# Patient Record
Sex: Female | Born: 1970 | Race: Asian | Hispanic: No | Marital: Married | Smoking: Never smoker
Health system: Southern US, Community
[De-identification: ages and names within clinical notes are randomized; demographics above are authoritative.]

---

## 2010-02-23 ENCOUNTER — Emergency Department (HOSPITAL_COMMUNITY): Admission: EM | Admit: 2010-02-23 | Discharge: 2010-02-23 | Payer: Self-pay | Admitting: Emergency Medicine

## 2010-05-04 ENCOUNTER — Ambulatory Visit (HOSPITAL_COMMUNITY): Admission: RE | Admit: 2010-05-04 | Discharge: 2010-05-04 | Payer: Self-pay | Admitting: Obstetrics and Gynecology

## 2010-05-04 IMAGING — US US OB DETAIL+14 WK
1 series · 18 of 28 positions shown · non-contrast
Comparison: none

OBSTETRICAL ULTRASOUND:
 This ultrasound was performed in The [HOSPITAL], and the [facility] report will be stored to [REDACTED] PACS.  This report is also available in [HOSPITAL]?s accessANYware.

[Series 1: us ob detail+14 wk · 18 of 36 slices shown]
[im 1/36]
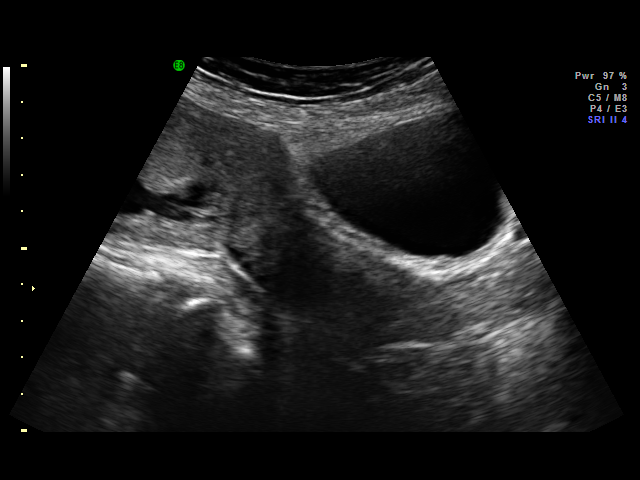
[im 3/36]
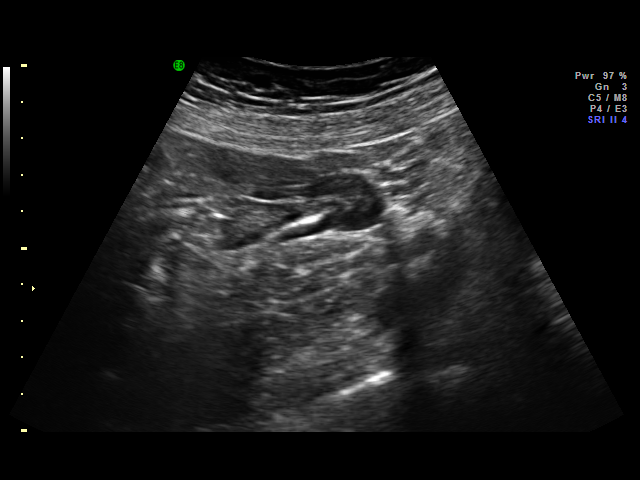
[im 4/36]
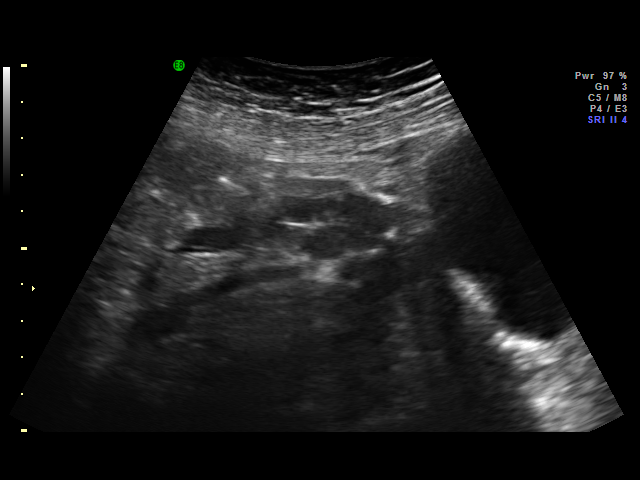
[im 7/36]
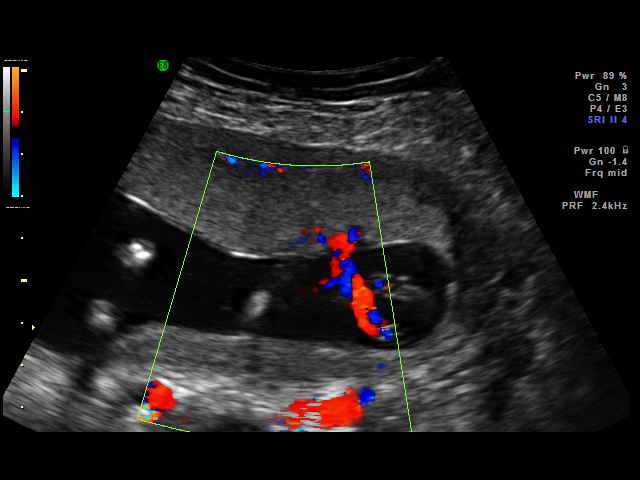
[im 10/36]
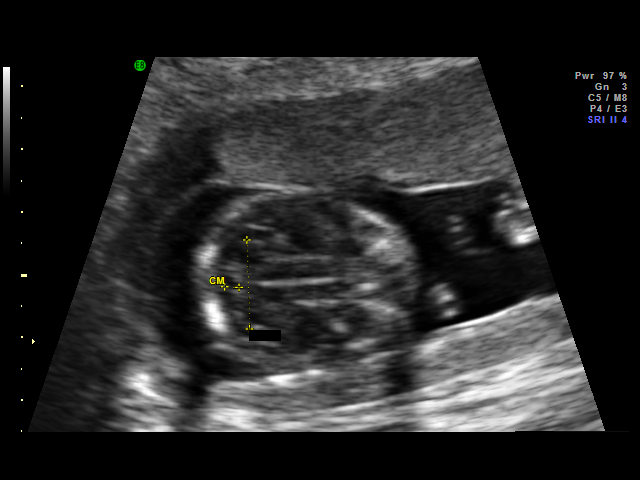
[im 11/36]
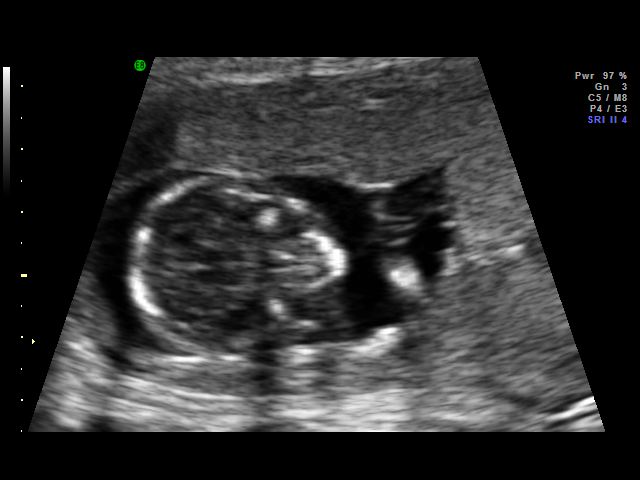
[im 13/36]
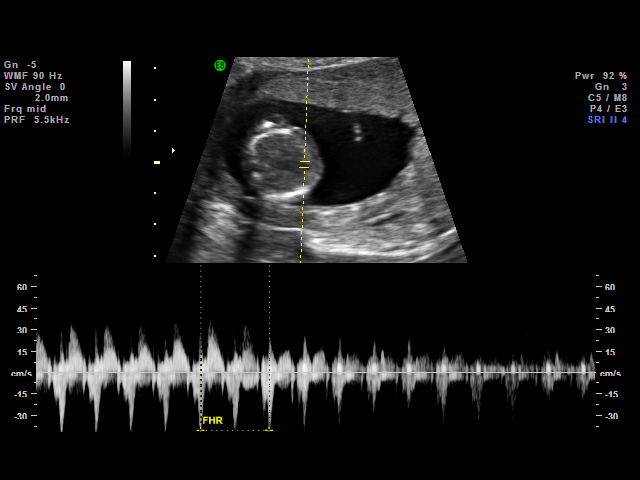
[im 15/36]
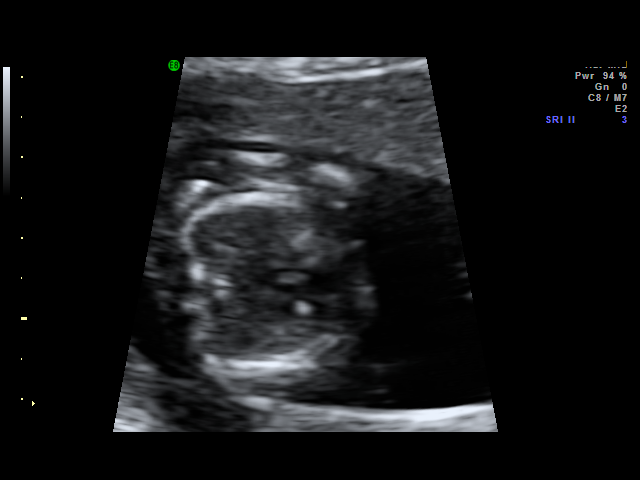
[im 17/36]
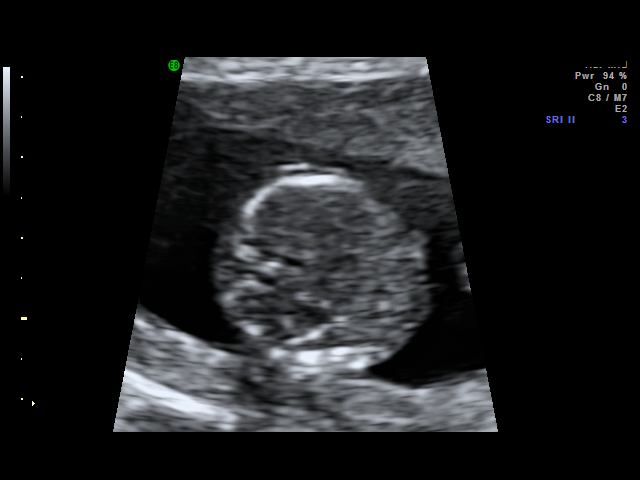
[im 19/36]
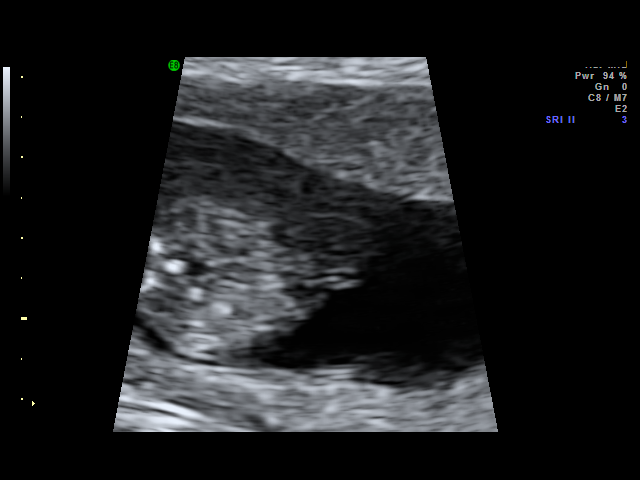
[im 21/36]
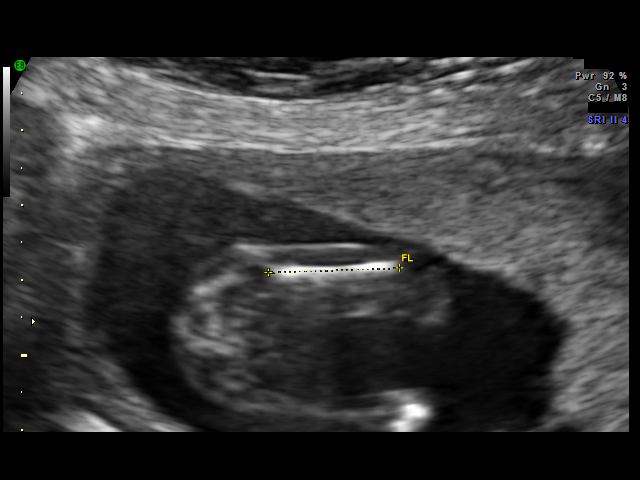
[im 23/36]
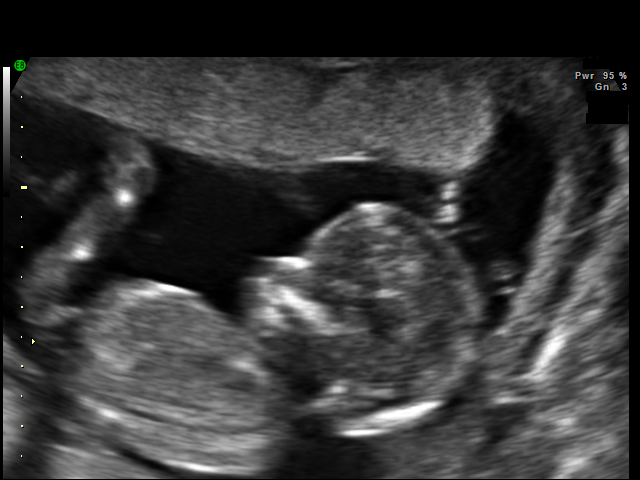
[im 25/36]
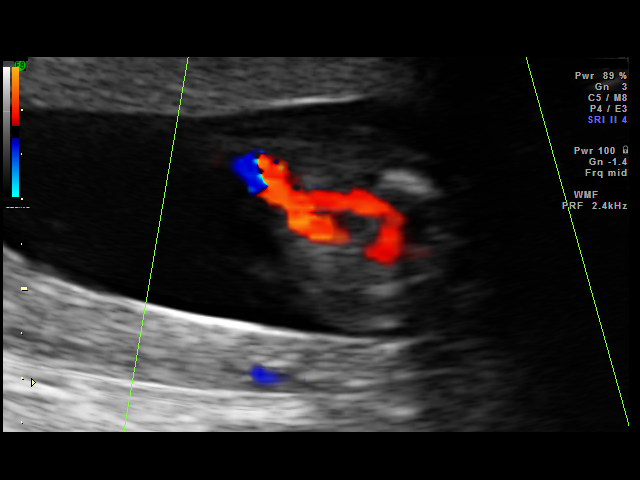
[im 28/36]
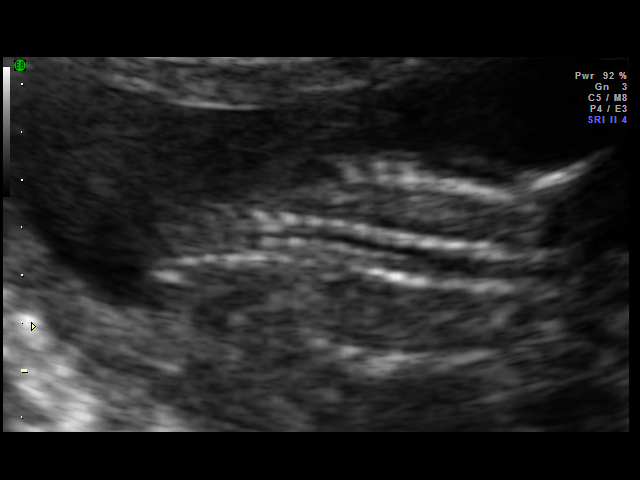
[im 29/36]
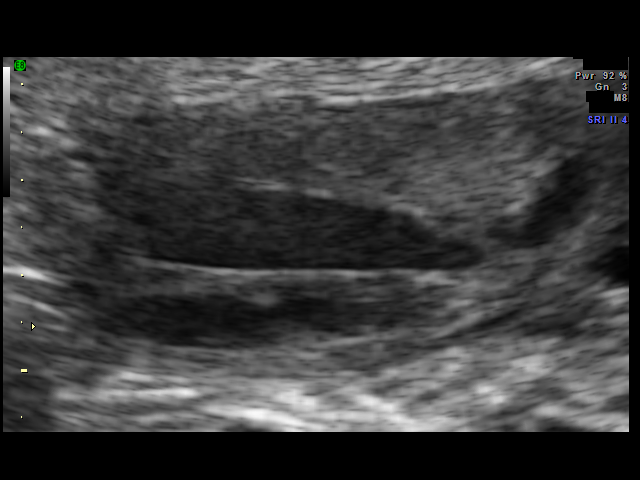
[im 32/36]
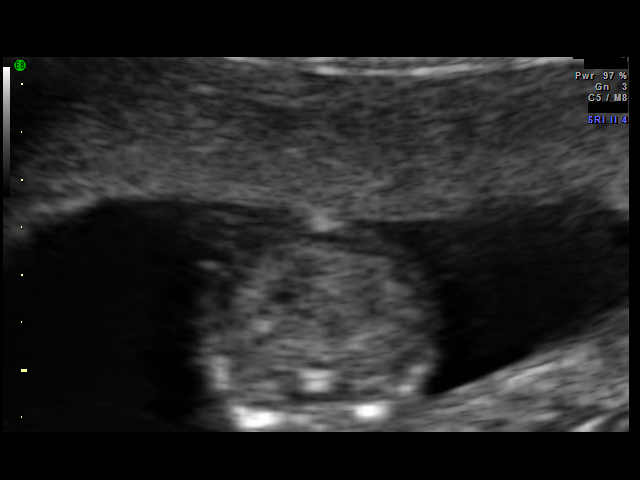
[im 33/36]
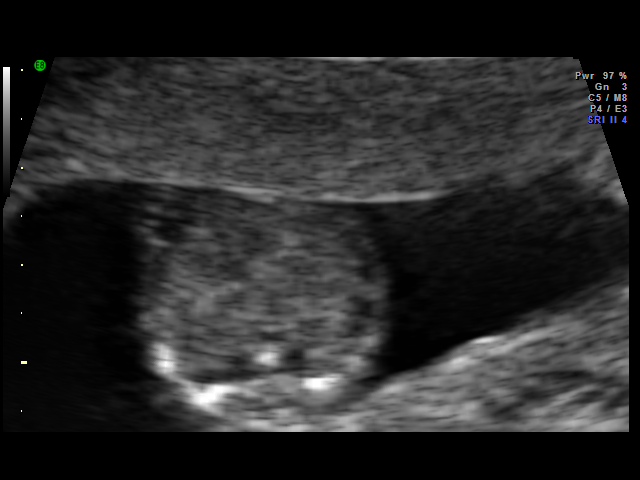
[im 36/36]
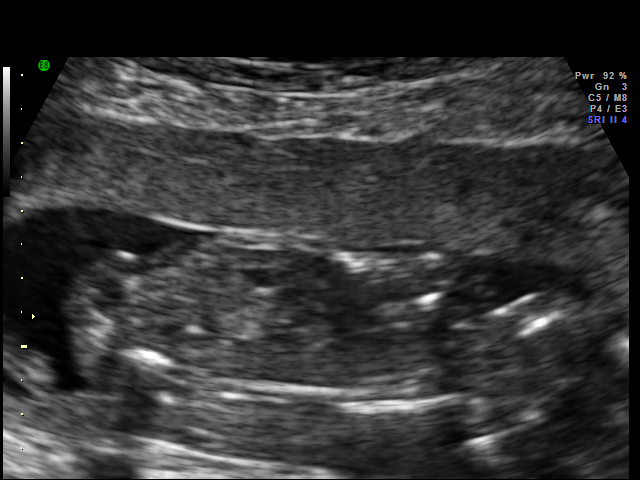

[18 of 28 positions shown; findings below may reference images not displayed]

IMPRESSION: [facility] has also been faxed to the ordering physician.

## 2010-05-18 ENCOUNTER — Ambulatory Visit (HOSPITAL_COMMUNITY): Admission: RE | Admit: 2010-05-18 | Discharge: 2010-05-18 | Payer: Self-pay | Admitting: Obstetrics and Gynecology

## 2010-10-01 LAB — URINALYSIS, ROUTINE W REFLEX MICROSCOPIC
Nitrite: NEGATIVE
Specific Gravity, Urine: 1.014 (ref 1.005–1.030)
Urobilinogen, UA: 0.2 mg/dL (ref 0.0–1.0)

## 2010-10-01 LAB — DIFFERENTIAL
Basophils Absolute: 0 10*3/uL (ref 0.0–0.1)
Basophils Relative: 0 % (ref 0–1)
Neutro Abs: 8.5 10*3/uL — ABNORMAL HIGH (ref 1.7–7.7)
Neutrophils Relative %: 74 % (ref 43–77)

## 2010-10-01 LAB — WET PREP, GENITAL: Yeast Wet Prep HPF POC: NONE SEEN

## 2010-10-01 LAB — ABO/RH: ABO/RH(D): O POS

## 2010-10-01 LAB — CBC
MCHC: 33.8 g/dL (ref 30.0–36.0)
RDW: 12.5 % (ref 11.5–15.5)

## 2010-10-01 LAB — GC/CHLAMYDIA PROBE AMP, GENITAL: GC Probe Amp, Genital: NEGATIVE

## 2010-10-01 LAB — POCT PREGNANCY, URINE: Preg Test, Ur: POSITIVE

## 2015-10-09 ENCOUNTER — Ambulatory Visit: Payer: 59

## 2016-04-11 ENCOUNTER — Ambulatory Visit (INDEPENDENT_AMBULATORY_CARE_PROVIDER_SITE_OTHER): Payer: 59 | Admitting: Physician Assistant

## 2016-04-11 ENCOUNTER — Ambulatory Visit (INDEPENDENT_AMBULATORY_CARE_PROVIDER_SITE_OTHER): Payer: 59

## 2016-04-11 VITALS — BP 110/72 | HR 64 | Temp 98.2°F | Resp 18 | Ht <= 58 in | Wt 106.0 lb

## 2016-04-11 DIAGNOSIS — M545 Low back pain, unspecified: Secondary | ICD-10-CM

## 2016-04-11 MED ORDER — MELOXICAM 15 MG PO TABS: 15.0000 mg | ORAL_TABLET | Freq: Every day | ORAL | 0 refills | Status: AC

## 2016-04-11 NOTE — Patient Instructions (Addendum)
There is an abnormality in the low back that we see on the xray. We a re not sure what it is. It may be normal for you, but we do not have anything to compare it to. We need a specialist to help figure out if the area is a problem.  The orthopedic office will contact you directly to schedule an appointment there to further evaluate the abnormality seen on the xray of the low back.  The medicine (meloxicam) can help reduce the pain. It is optional. You do not have to take it if you do not want to.  There is no evidence of an IUD on the xray. We will request the [facility] to clarify that one was placed in 2013.    IF you received an x-ray today, you will receive an invoice [facility]. Please contact [facility] at [phone] with questions or concerns regarding your invoice.   IF you received labwork today, you will receive an invoice from [facility]. Please contact [facility] at [phone] with questions or concerns regarding your invoice.   Our billing staff will not be able to assist you with questions regarding bills from these companies.  You will be contacted with the lab results as soon as they are available. The fastest way to get your results is to activate your My Chart account. Instructions are located on the last page of this paperwork. If you have not heard from us regarding the results in 2 weeks, please contact this office.

## 2016-04-11 NOTE — Progress Notes (Signed)
Patient ID: Brittany Andrews, female     DOB: [birth year], 45 y.o.    MRN: [id number]  PCP: No PCP Per Patient  Chief Complaint  Patient presents with  . IUD Removal    Subjective:    HPI  Presents for IUD removal. She is accompanied by her daughter, who translates.  Following the birth of her last child (#8), she may have had an IUD placed. The patient isn't sure, but based on her recollection of the visit in 2013, and her daughter's Internet search, they believe it was an IUD. Beginning in 2015, she relates LEFT-sided back pain that starts low and moves up the LEFT side to the shoulder and a "cold" sensation in the uterus.  Initially, she tells me that she plans to change to Nexplanon for contraception. When I advised that she wait until the Nexplanon can be placed to have the IUD removed, she insists that the IUD be removed today, citing that it would be ok if she became pregnant now, and then that she and her husband would like to have another child.  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 No other aggravating factors. No alleviating factors. Has tried nothing to help alleviate the pain.  No urinary urgency, frequency or burning. No saddle anesthesia. No radicular pain or LE paresthesias.  No dyspareunia. No unusual bleeding, just her regular menses, most recently 04/06/2016.  No nausea, vomiting, diarrhea.     Prior to Admission medications   Not on File     No Known Allergies   There are no active problems to display for this patient.    History reviewed. No pertinent family history.   Social History   Social History  . Marital status: Married    Spouse name: N/A  . Number of children: 8  . Years of education: 2nd grade +   Occupational History  . packing, lifting    Social History Main Topics  . Smoking status: Never Smoker  . Smokeless tobacco: Never Used  . Alcohol use No  . Drug use:  No  . Sexual activity: Not on file   Other Topics Concern  . Not on file   Social History Narrative   [place].   Came to the [place] in 2009.   Lives with her husband and their children.   Educated through the 2nd grade, but then took some classes at [employer].        Review of Systems As above. No chest pain, SOB, HA, dizziness, vision change, N/V, diarrhea, constipation, dysuria, urinary urgency or frequency, myalgias, arthralgias or rash.       Objective:  Physical Exam  Constitutional: She is oriented to person, place, and time. She appears well-developed and well-nourished. She is active and cooperative. No distress.  BP 110/72 (BP Location: Right Arm, Patient Position: Sitting, Cuff Size: Small)   Pulse 64   Temp 98.2 F (36.8 C) (Oral)   Resp 18   Ht 4' 7.5" (1.41 m)   Wt 106 lb (48.1 kg)   LMP 04/06/2016 (Exact Date)   SpO2 98%   BMI 24.19 kg/m   HENT:  Head: Normocephalic and atraumatic.  Right Ear: Hearing normal.  Left Ear: Hearing normal.  Eyes: Conjunctivae are normal. No scleral icterus.  Neck: Normal range of motion. Neck supple. No thyromegaly present.  Cardiovascular: Normal rate, regular rhythm and normal heart sounds.   Pulses:  Radial pulses are 2+ on the right side, and 2+ on the left side.  Pulmonary/Chest: Effort normal and breath sounds normal.  Musculoskeletal:       Cervical back: Normal.       Thoracic back: She exhibits tenderness (mild) and pain. She exhibits normal range of motion, no bony tenderness, no swelling and no spasm.       Lumbar back: She exhibits tenderness (mild) and pain. She exhibits normal range of motion, no bony tenderness and no spasm.  Very mild tenderness of the paraspinous muscles on the LEFT  Lymphadenopathy:       Head (right side): No tonsillar, no preauricular, no posterior auricular and no occipital adenopathy present.       Head (left side): No tonsillar, no preauricular, no posterior auricular and no  occipital adenopathy present.    She has no cervical adenopathy.       Right: No supraclavicular adenopathy present.       Left: No supraclavicular adenopathy present.  Neurological: She is alert and oriented to person, place, and time. No sensory deficit.  Skin: Skin is warm, dry and intact. No rash noted. No cyanosis or erythema. Nails show no clubbing.  Psychiatric: She has a normal mood and affect. Her speech is normal and behavior is normal.         Dg Lumbar Spine Complete  Result Date: 04/11/2016 CLINICAL DATA:  Chronic left lower back pain EXAM: LUMBAR SPINE - COMPLETE 4+ VIEW COMPARISON:  None in PACs FINDINGS: The lumbar vertebral bodies are preserved in height. The disc space heights are well maintained. There is scleroses of the subarticular bone to the right of midline along the superior endplate of L3. The pedicles and transverse processes are intact. The observed portions of the sacrum are normal. No IUD is visible though the pelvis is not completely included in the field of view. IMPRESSION: No acute lumbar spine abnormality is observed. 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 This may be benign or malignant and correlation with the patient's clinical history will be needed. Given the lack of any previous studies available for comparison, lumbar spine MRI is recommended. No IUD is observed. Electronically Signed   By: David  [place] M.D.   On: 04/11/2016 11:05       Assessment & Plan:  1. Left-sided low back pain without sciatica Trial of meloxicam, though the patient asks if she has to take it. Certainly not, as it's for her comfort. Refer to orthopedics to help identify the cause of the sclerosis at L3.  - DG Lumbar Spine Complete; Future - meloxicam (MOBIC) 15 MG tablet; Take 1 tablet (15 mg total) by mouth daily.  Dispense: 30 tablet; Refill: 0 - Ambulatory referral to Orthopedic Surgery   The IUD does not appear on the  radiographs, and it's not entirely clear that she actually had one placed. [facility] May recommend re-evaluation with OBGYN.    Fernande Brashelle S. Brodi Nery, PA-C Physician Assistant-Certified [facility]

## 2016-05-08 ENCOUNTER — Other Ambulatory Visit: Payer: Self-pay | Admitting: Physician Assistant

## 2016-05-08 DIAGNOSIS — M545 Low back pain, unspecified: Secondary | ICD-10-CM
